# Patient Record
Sex: Male | Born: 1995 | Race: White | Hispanic: No | Marital: Single
Health system: Southern US, Community
[De-identification: ages and names within clinical notes are randomized; demographics above are authoritative.]

---
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
## 2014-07-07 ENCOUNTER — Emergency Department: Payer: Self-pay | Admitting: Emergency Medicine

## 2014-07-08 LAB — COMPREHENSIVE METABOLIC PANEL
Albumin: 4.2 g/dL (ref 3.4–5.0)
Alkaline Phosphatase: 88 U/L
Anion Gap: 5 — ABNORMAL LOW (ref 7–16)
BILIRUBIN TOTAL: 0.5 mg/dL (ref 0.2–1.0)
BUN: 12 mg/dL (ref 7–18)
CHLORIDE: 105 mmol/L (ref 98–107)
CO2: 32 mmol/L (ref 21–32)
Calcium, Total: 9 mg/dL (ref 8.5–10.1)
Creatinine: 0.87 mg/dL (ref 0.60–1.30)
EGFR (African American): >60
EGFR (Non-African Amer.): >60
GLUCOSE: 101 mg/dL — AB (ref 65–99)
Osmolality: 283 (ref 275–301)
Potassium: 3.7 mmol/L (ref 3.5–5.1)
SGOT(AST): 41 U/L — ABNORMAL HIGH (ref 15–37)
SGPT (ALT): 21 U/L
SODIUM: 142 mmol/L (ref 136–145)
Total Protein: 7.3 g/dL (ref 6.4–8.2)

## 2016-05-31 ENCOUNTER — Other Ambulatory Visit: Payer: Self-pay | Admitting: Neurology

## 2016-05-31 DIAGNOSIS — G44229 Chronic tension-type headache, not intractable: Secondary | ICD-10-CM

## 2016-06-14 ENCOUNTER — Ambulatory Visit
Admission: RE | Admit: 2016-06-14 | Discharge: 2016-06-14 | Disposition: A | Discharge status: Home / Self Care | Payer: BLUE CROSS/BLUE SHIELD | Source: Ambulatory Visit | Attending: Neurology | Admitting: Neurology

## 2016-06-14 DIAGNOSIS — G44229 Chronic tension-type headache, not intractable: Secondary | ICD-10-CM | POA: Diagnosis not present

## 2016-06-14 MED ORDER — GADOBENATE DIMEGLUMINE 529 MG/ML IV SOLN
13.0000 mL | Freq: Once | INTRAVENOUS | Status: AC | PRN
  Administered 2016-06-14: 13 mL via INTRAVENOUS

## 2016-06-22 ENCOUNTER — Other Ambulatory Visit: Payer: Self-pay | Admitting: Neurology

## 2016-06-22 DIAGNOSIS — B832 Angiostrongyliasis due to Parastrongylus cantonensis: Secondary | ICD-10-CM

## 2016-06-22 DIAGNOSIS — B839 Helminthiasis, unspecified: Secondary | ICD-10-CM

## 2016-06-27 ENCOUNTER — Ambulatory Visit
Admission: RE | Admit: 2016-06-27 | Discharge: 2016-06-27 | Disposition: A | Discharge status: Home / Self Care | Payer: BLUE CROSS/BLUE SHIELD | Source: Ambulatory Visit | Attending: Neurology | Admitting: Neurology

## 2016-06-27 DIAGNOSIS — B839 Helminthiasis, unspecified: Secondary | ICD-10-CM | POA: Diagnosis present

## 2016-06-27 DIAGNOSIS — B832 Angiostrongyliasis due to Parastrongylus cantonensis: Secondary | ICD-10-CM

## 2016-06-27 LAB — CSF CELL COUNT WITH DIFFERENTIAL
Eosinophils, CSF: 0 %
LYMPHS CSF: 75 %
MONOCYTE-MACROPHAGE-SPINAL FLUID: 0 %
OTHER CELLS CSF: 0
RBC COUNT CSF: 22 /mm3 — AB (ref 0–3)
SEGMENTED NEUTROPHILS-CSF: 25 %
Tube #: 4
WBC CSF: 14 /mm3 — AB (ref 0–5)

## 2016-06-27 LAB — CBC WITH DIFFERENTIAL/PLATELET
BASOS ABS: 0 10*3/uL (ref 0–0.1)
Basophils Relative: 0 %
EOS ABS: 0.1 10*3/uL (ref 0–0.7)
EOS PCT: 1 %
HCT: 46.5 % (ref 40.0–52.0)
Hemoglobin: 15.5 g/dL (ref 13.0–18.0)
Lymphocytes Relative: 32 %
Lymphs Abs: 1.3 10*3/uL (ref 1.0–3.6)
MCH: 29.6 pg (ref 26.0–34.0)
MCHC: 33.3 g/dL (ref 32.0–36.0)
MCV: 89 fL (ref 80.0–100.0)
MONO ABS: 0.4 10*3/uL (ref 0.2–1.0)
Monocytes Relative: 11 %
Neutro Abs: 2.2 10*3/uL (ref 1.4–6.5)
Neutrophils Relative %: 56 %
PLATELETS: 217 10*3/uL (ref 150–440)
RBC: 5.22 MIL/uL (ref 4.40–5.90)
RDW: 12.4 % (ref 11.5–14.5)
WBC: 4 10*3/uL (ref 3.8–10.6)

## 2016-06-27 LAB — BASIC METABOLIC PANEL
Anion gap: 8 (ref 5–15)
BUN: 14 mg/dL (ref 6–20)
CALCIUM: 9.5 mg/dL (ref 8.9–10.3)
CO2: 26 mmol/L (ref 22–32)
Chloride: 104 mmol/L (ref 101–111)
Creatinine, Ser: 0.85 mg/dL (ref 0.61–1.24)
GFR calc Af Amer: >60 mL/min (ref >60)
Glucose, Bld: 79 mg/dL (ref 65–99)
POTASSIUM: 3.5 mmol/L (ref 3.5–5.1)
SODIUM: 138 mmol/L (ref 135–145)

## 2016-06-27 LAB — PROTEIN, CSF: TOTAL PROTEIN, CSF: 27 mg/dL (ref 15–45)

## 2016-06-27 MED ORDER — LIDOCAINE HCL (PF) 1 % IJ SOLN
5.0000 mL | Freq: Once | INTRAMUSCULAR | Status: AC
  Administered 2016-06-27: 5 mL
  Filled 2016-06-27: qty 5

## 2016-06-27 MED ORDER — ACETAMINOPHEN 500 MG PO TABS
1000.0000 mg | ORAL_TABLET | Freq: Four times a day (QID) | ORAL | Status: DC | PRN
  Administered 2016-06-27: 1000 mg via ORAL
  Filled 2016-06-27 (×2): qty 2

## 2016-06-27 NOTE — Progress Notes (Signed)
Dr. Sw[place] in to see patient and parents. Spoke at length about procedure and results. Patient to see neuro tomorrow. Patient given tylenol for his headache. Parents at bedside.

## 2016-06-29 LAB — MISC LABCORP TEST (SEND OUT): Labcorp test code: 160457

## 2018-06-07 IMAGING — MR MR HEAD WO/W CM
10 series · 43 of 48 positions shown · IV contrast (13 ML MULTIHANCE)
Comparison: Head CT 07/08/2014

CLINICAL DATA: Headaches over the last 3 months. Some associated
dizziness.

EXAM:
MRI HEAD WITHOUT AND WITH CONTRAST
TECHNIQUE: Multiplanar, multiecho pulse sequences of the brain and surrounding
structures were obtained without and with intravenous contrast.
CONTRAST:  13mL MULTIHANCE GADOBENATE DIMEGLUMINE 529 MG/ML IV SOLN

[Series 2: T1 · sagittal · 5.0mm · 0.45mm/px · 2 of 23 slices shown (1 of 2)]
[im 1/23]
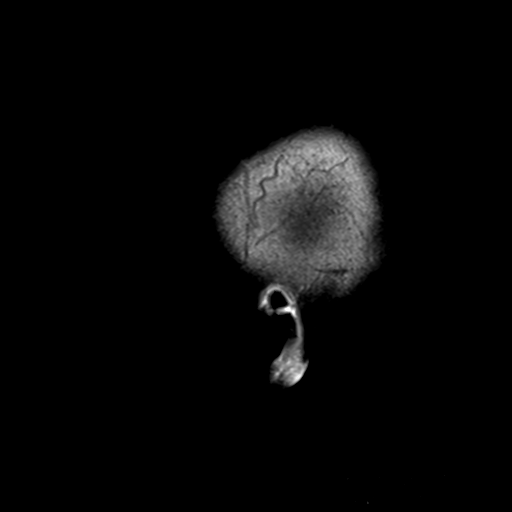
[im 23/23]
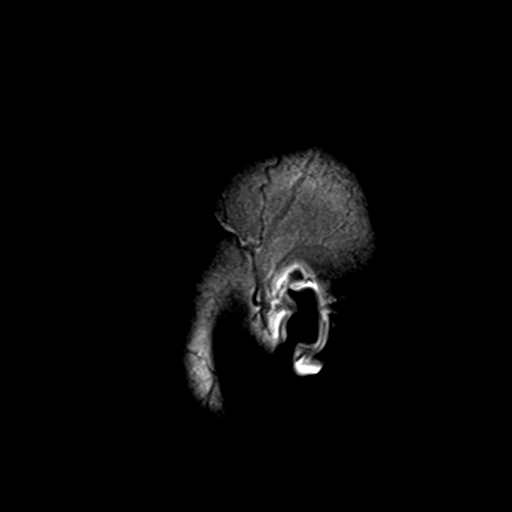

[Series 4: DWI · axial · 3.0mm · 1.20mm/px · z∈[-60,+100]mm · 6 of 55 slices shown (1 of 2)]
[im 1/55]
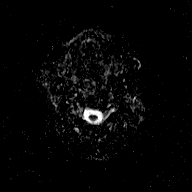
[im 11/55]
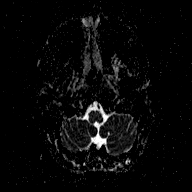
[im 22/55]
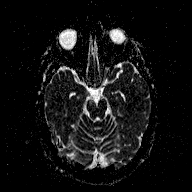
[im 33/55]
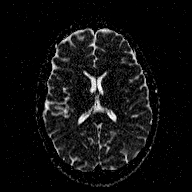
[im 44/55]
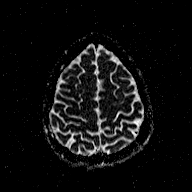
[im 55/55]
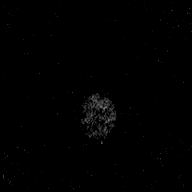

[Series 5: T2 · axial · 5.0mm · 0.72mm/px · z∈[-58,+97]mm · 3 of 25 slices shown (1 of 2)]
[im 1/25]
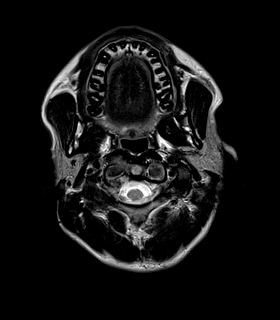
[im 13/25]
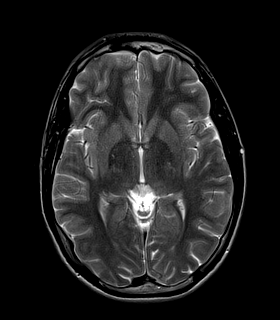
[im 25/25]
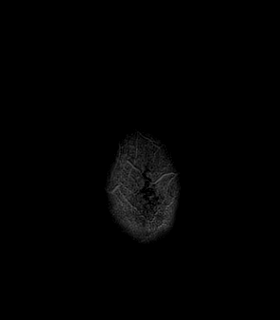

[Series 6: FLAIR · axial · 5.0mm · 0.45mm/px · z∈[-58,+97]mm · 3 of 25 slices shown]
[im 1/25]
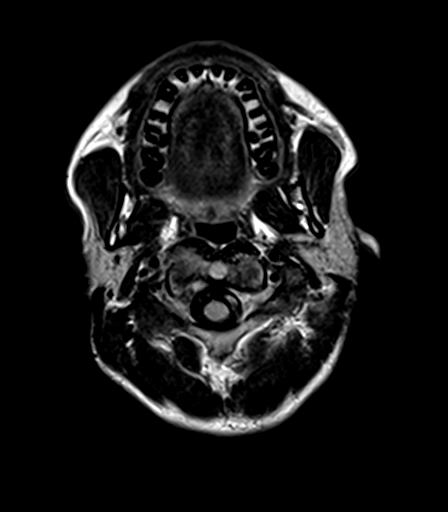
[im 13/25]
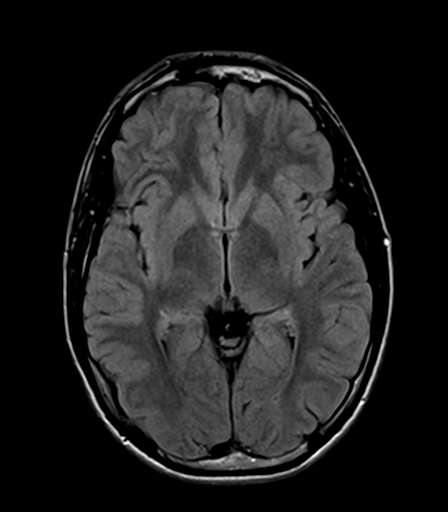
[im 25/25]
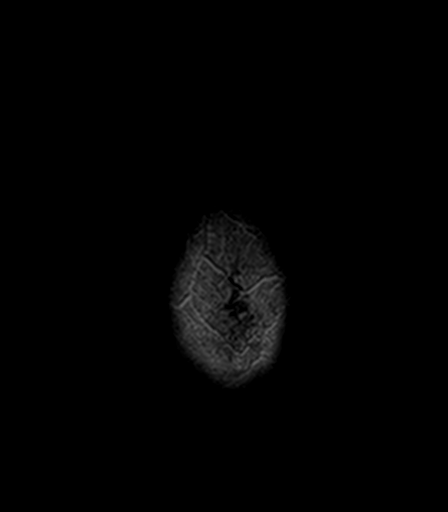

[Series 7: T2 · axial · 5.0mm · 0.72mm/px · z∈[-58,+96]mm · 3 of 25 slices shown (2 of 2)]
[im 1/25]
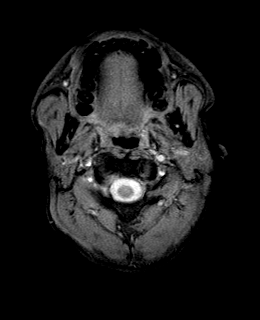
[im 13/25]
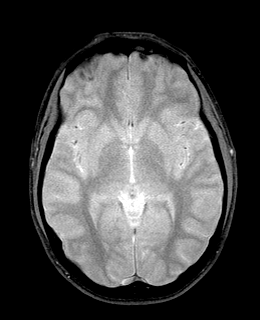
[im 25/25]
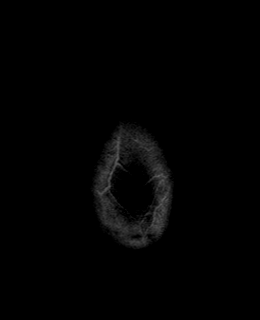

[Series 8: T1 · axial · 3.0mm · 1.00mm/px · z∈[-77,-23]mm · 3 of 64 slices shown (2 of 2)]
[im 1/64]
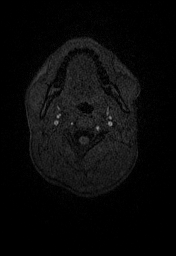
[im 10/64]
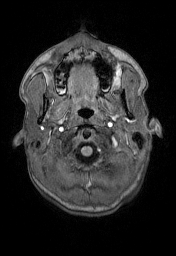
[im 19/64]
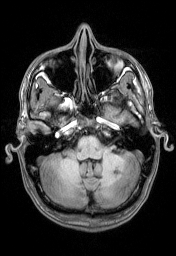

[Series 9: T2 post-contrast · coronal · 5.0mm · 0.45mm/px · 4 of 31 slices shown]
[im 1/31]
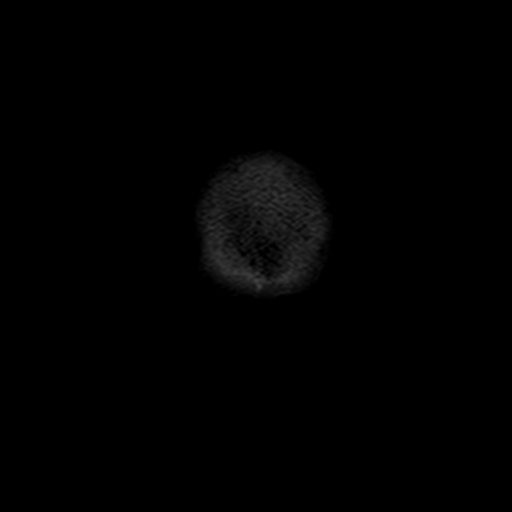
[im 11/31]
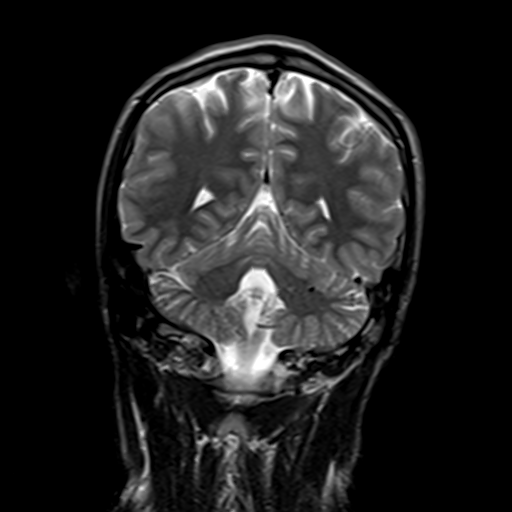
[im 21/31]
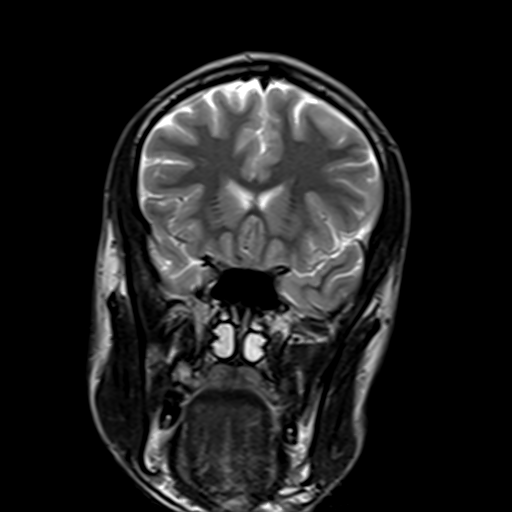
[im 31/31]
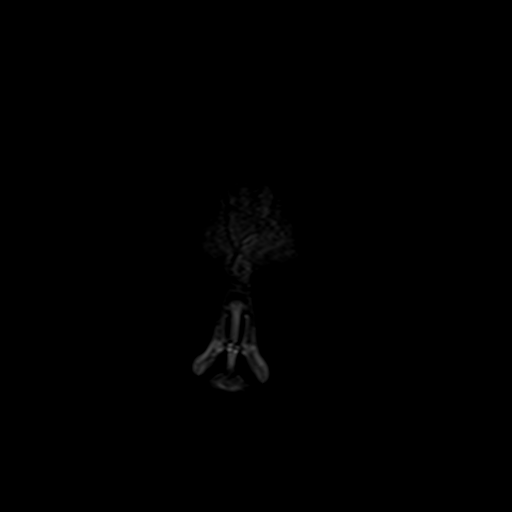

[Series 10: T1 post-contrast · axial · 3.0mm · 1.00mm/px · z∈[-77,+111]mm · 8 of 64 slices shown (1 of 2)]
[im 1/64]
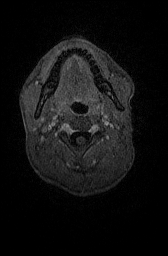
[im 10/64]
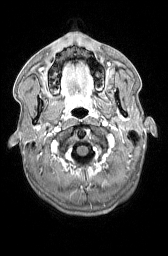
[im 19/64]
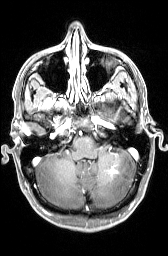
[im 28/64]
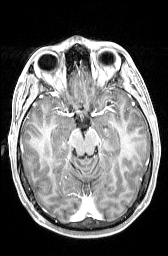
[im 37/64]
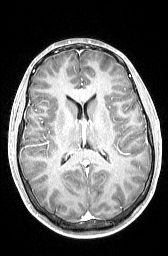
[im 46/64]
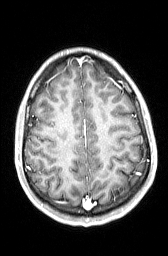
[im 55/64]
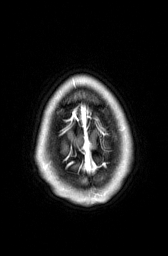
[im 64/64]
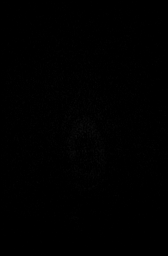

[Series 11: T1 post-contrast · coronal · 5.0mm · 0.45mm/px · 4 of 29 slices shown (2 of 2)]
[im 1/29]
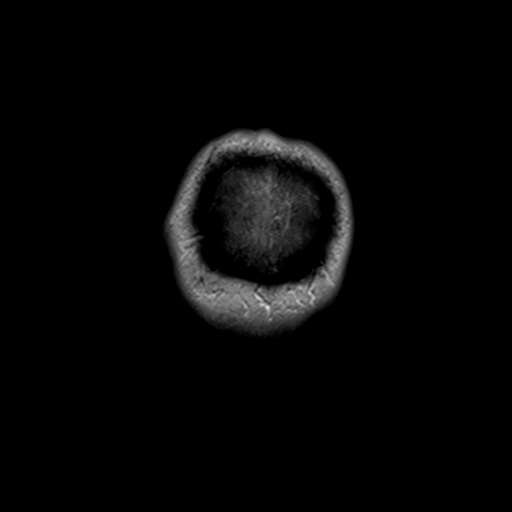
[im 10/29]
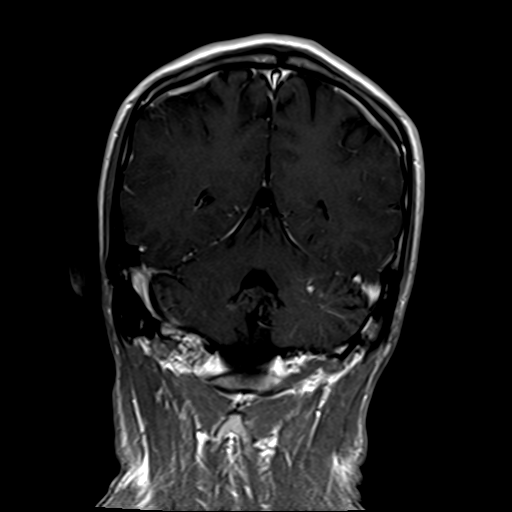
[im 19/29]
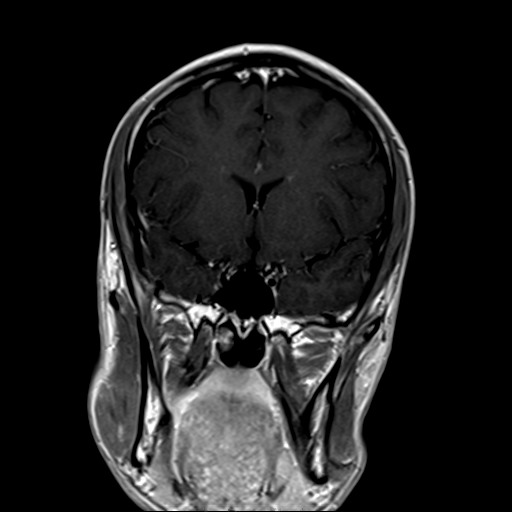
[im 29/29]
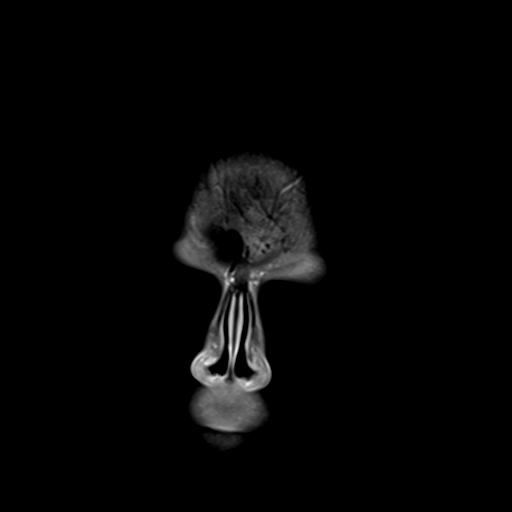

[Series 100: DWI · axial · 3.0mm · 1.20mm/px · z∈[-60,+100]mm · 7 of 55 slices shown (2 of 2)]
[im 1/55]
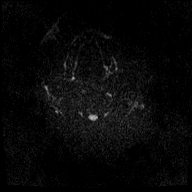
[im 10/55]
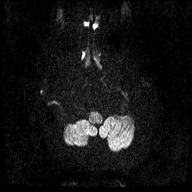
[im 19/55]
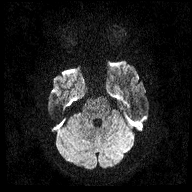
[im 28/55]
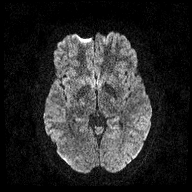
[im 37/55]
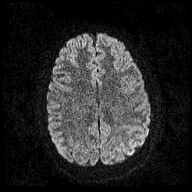
[im 46/55]
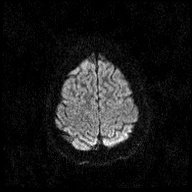
[im 55/55]
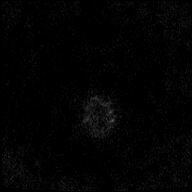

[43 of 48 positions shown; findings below may reference images not displayed]

FINDINGS: Brain: No evidence of acquired pathology. No evidence of old or
acute infarction, neoplastic mass lesion, hemorrhage, hydrocephalus
or extra-axial collection. There is a large developmental venous
anomaly in the left cerebellum, but as noted above, there is no MR
evidence that there has been previous hemorrhage and there is no
sign of brain gliosis. In the absence of those findings, it would be
difficult understand how this could be symptomatic. I do not see MR
evidence of any high-flow component.

CP angle regions are normal. Pituitary gland is normal. No abnormal
enhancement occurs elsewhere.

Vascular: Major vessels at base of the brain show flow. See above
discussion of left cerebellar developmental venous anomaly.

Skull and upper cervical spine: Normal

Sinuses/Orbits: Clear/normal

Other: None significant
IMPRESSION: Normal examination with the exception of a large developmental
venous anomaly of the left cerebellum. There is no MR evidence of
previous hemorrhage or brain gliosis. Usually, these abnormalities
are asymptomatic. However, this one is large the patient does have
symptoms that could be cerebellar in nature. Therefore, the
significance is uncertain.

## 2018-06-20 IMAGING — RF DG FLUORO GUIDE LUMBAR PUNCTURE
1 series · 2 of 2 positions shown · non-contrast
Comparison: none

CLINICAL DATA: Four months of unremitting headache with negative
MRI and blood work. [place] this [place].

[Series 1: cp_standard · 0.18mm/px · 2 of 2 slices shown]
[im 1/2]
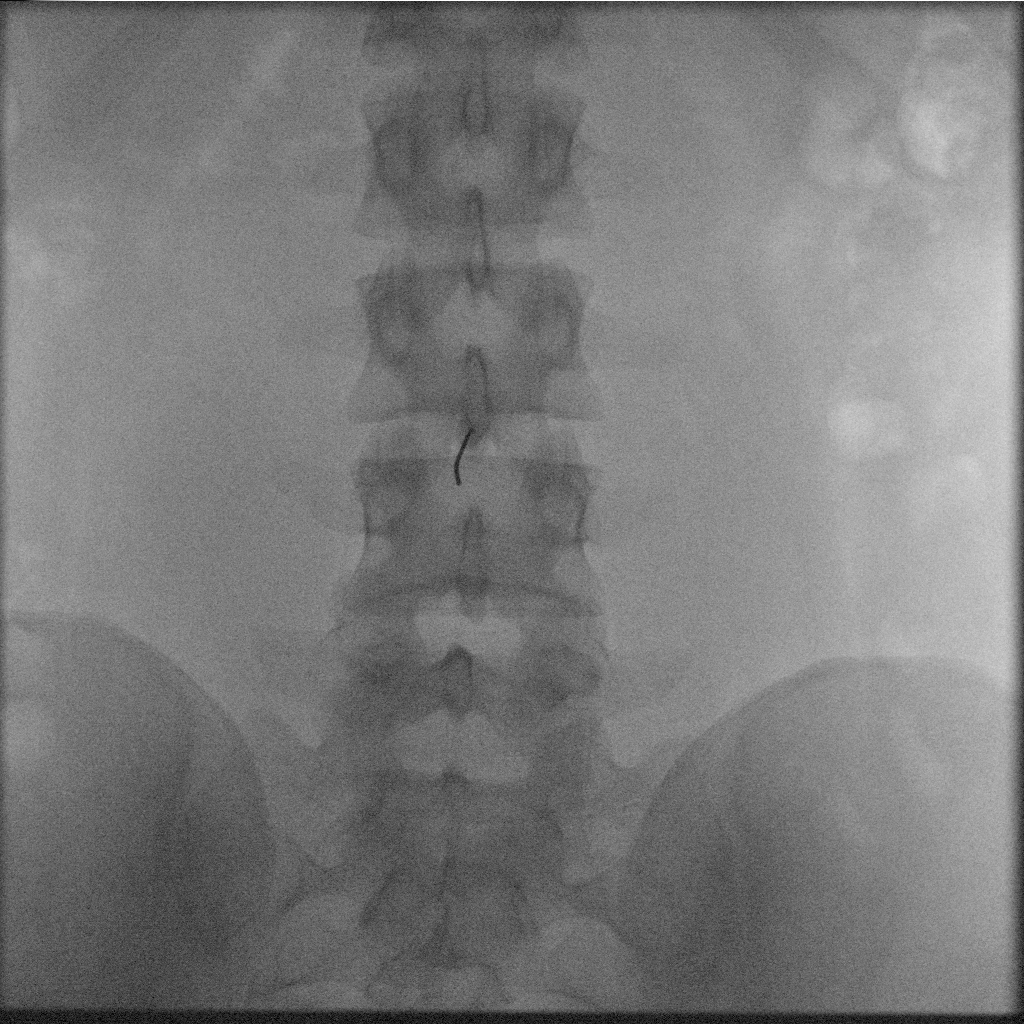
[im 2/2]
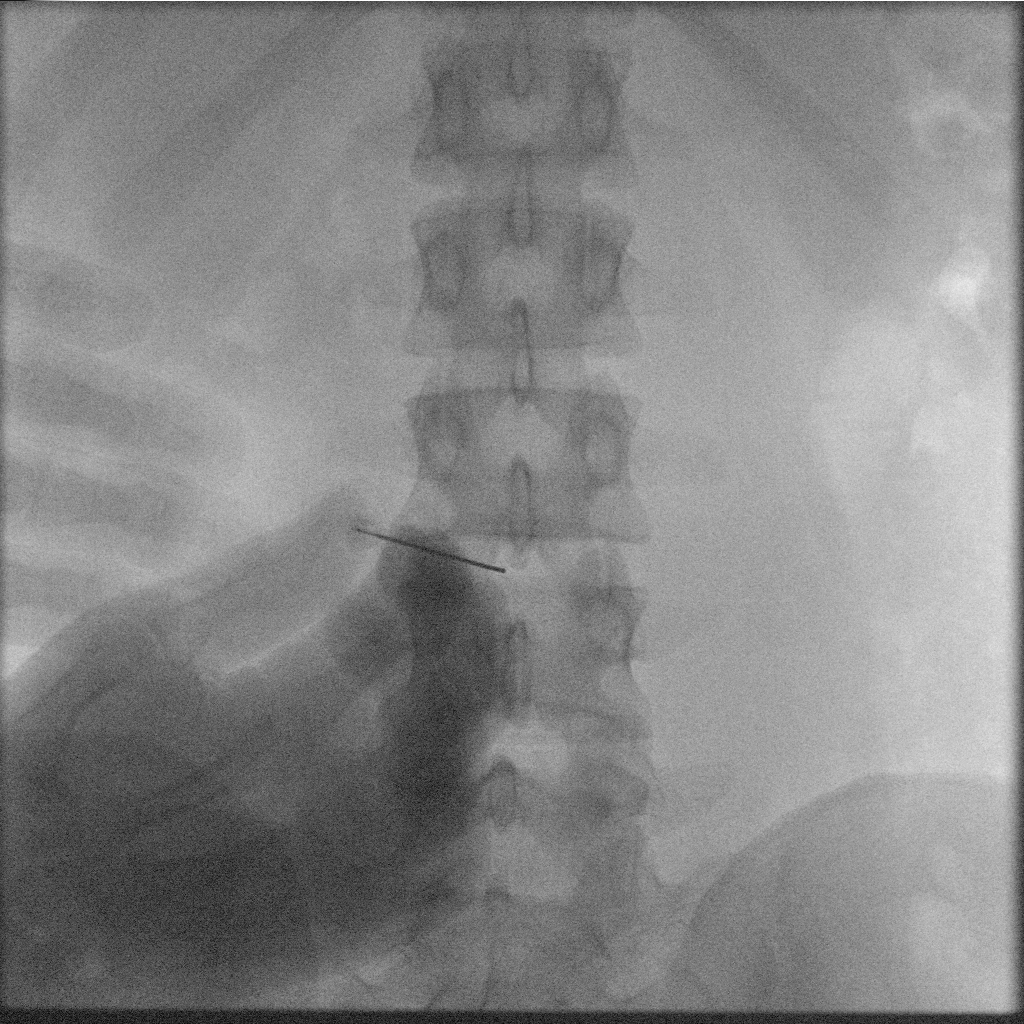

[2 of 2 positions shown; findings below may reference images not displayed]

EXAM:
DIAGNOSTIC LUMBAR PUNCTURE UNDER FLUOROSCOPIC GUIDANCE

FLUOROSCOPY TIME:  Fluoroscopy Time:  36 seconds

Radiation Exposure Index (if provided by the fluoroscopic device):
83.32 micro Gy per meter squared

Number of Acquired Spot Images: 1

PROCEDURE:
Informed consent was obtained from the patient prior to the
procedure, including potential complications of headache, allergy,
and pain. With the patient prone, the lower back was prepped with
Betadine. 1% Lidocaine was used for local anesthesia. Lumbar
puncture was performed at the L4 level using a 20 gauge needle with
return of clear colorless CSF with an opening pressure of 20 cm
water. Two ml of CSF were obtained for laboratory studies. Attempts
at repositioning the needle did not yield additional fluid. The
patient tolerated the procedure well and there were no apparent
complications.

The patient was sent to [facility] for 2 hours prior to
discharge.
IMPRESSION: Lumbar puncture for diagnostic purposes which yielded only 2 cc of
clear colorless spinal fluid and an opening pressure of 20 cm. The
patient tolerated the procedure well and was sent to [facility] in good condition.
